# Patient Record
Sex: Female | Born: 1960 | Hispanic: Yes | Marital: Single | Smoking: Never smoker
Health system: Southern US, Community
[De-identification: ages and names within clinical notes are randomized; demographics above are authoritative.]

## PROBLEM LIST (undated history)

## (undated) DIAGNOSIS — I10 Essential (primary) hypertension: Secondary | ICD-10-CM

## (undated) DIAGNOSIS — E7800 Pure hypercholesterolemia, unspecified: Secondary | ICD-10-CM

## (undated) HISTORY — PX: BREAST SURGERY: SHX581

## (undated) HISTORY — PX: CHOLECYSTECTOMY: SHX55

---

## 2009-10-09 ENCOUNTER — Ambulatory Visit: Payer: Self-pay | Admitting: Diagnostic Radiology

## 2009-10-09 ENCOUNTER — Ambulatory Visit (HOSPITAL_BASED_OUTPATIENT_CLINIC_OR_DEPARTMENT_OTHER): Admission: RE | Admit: 2009-10-09 | Discharge: 2009-10-09 | Payer: Self-pay | Admitting: Unknown Physician Specialty

## 2018-04-06 ENCOUNTER — Other Ambulatory Visit: Payer: Self-pay

## 2018-04-06 ENCOUNTER — Emergency Department (HOSPITAL_COMMUNITY)
Admission: EM | Admit: 2018-04-06 | Discharge: 2018-04-06 | Disposition: A | Discharge status: Home / Self Care | Payer: Self-pay | Attending: Emergency Medicine | Admitting: Emergency Medicine

## 2018-04-06 ENCOUNTER — Encounter (HOSPITAL_COMMUNITY): Payer: Self-pay | Admitting: Emergency Medicine

## 2018-04-06 DIAGNOSIS — R519 Headache, unspecified: Secondary | ICD-10-CM

## 2018-04-06 DIAGNOSIS — I10 Essential (primary) hypertension: Secondary | ICD-10-CM | POA: Insufficient documentation

## 2018-04-06 DIAGNOSIS — R51 Headache: Secondary | ICD-10-CM | POA: Insufficient documentation

## 2018-04-06 HISTORY — DX: Essential (primary) hypertension: I10

## 2018-04-06 HISTORY — DX: Pure hypercholesterolemia, unspecified: E78.00

## 2018-04-06 MED ORDER — METOCLOPRAMIDE HCL 5 MG/ML IJ SOLN
10.0000 mg | Freq: Once | INTRAMUSCULAR | Status: AC
  Administered 2018-04-06: 10 mg via INTRAVENOUS
  Filled 2018-04-06: qty 2

## 2018-04-06 MED ORDER — KETOROLAC TROMETHAMINE 15 MG/ML IJ SOLN
15.0000 mg | Freq: Once | INTRAMUSCULAR | Status: AC
Start: 2018-04-06 — End: 2018-04-06
  Administered 2018-04-06: 15 mg via INTRAVENOUS
  Filled 2018-04-06: qty 1

## 2018-04-06 MED ORDER — DIPHENHYDRAMINE HCL 50 MG/ML IJ SOLN
12.5000 mg | Freq: Once | INTRAMUSCULAR | Status: AC
  Administered 2018-04-06: 20:00:00 via INTRAVENOUS
  Filled 2018-04-06: qty 1

## 2018-04-06 MED ORDER — SODIUM CHLORIDE 0.9 % IV BOLUS
1000.0000 mL | Freq: Once | INTRAVENOUS | Status: AC
  Administered 2018-04-06: 1000 mL via INTRAVENOUS

## 2018-04-06 MED ORDER — BUTALBITAL-APAP-CAFFEINE 50-325-40 MG PO TABS: 1.0000 | ORAL_TABLET | Freq: Four times a day (QID) | ORAL | 0 refills | Status: DC | PRN

## 2018-04-06 NOTE — ED Triage Notes (Signed)
Pt c/o headache x 1 week. Seen by PCP, and started on new blood pressure medication yesterday with no change in headache. A&O x 4, no weakness noted.

## 2018-04-06 NOTE — ED Provider Notes (Signed)
[facility] EMERGENCY DEPARTMENT Provider Note   CSN: [id number] Arrival date & time: 04/06/18  4098     History   Chief Complaint Chief Complaint  Patient presents with  . Hypertension  . Headache    HPI Marie Kane is a 57 y.o. female.  HPI   57 year old female with headaches.  Had headaches intermittently for years.  Seem to be worse the higher blood pressure is.  Approximately 2 weeks ago her PCP took her off amlodipine for lower extremity swelling.  She began having headaches around that time.  They are intermittent.  Describes pressure and sometimes stabbing pain in the by temporal region sometimes radiating through to the top of her head into the back of her head.  She has not noticed any other appreciable exacerbating relieving factors.  No acute change in vision.  No nausea.  No photophobia.  No neck stiffness.  No confusion per family at bedside.  She went to see her PCP today who started back on amlodipine but at a lower dose.  He also prescribed naproxen.  She started these today with a headache has persisted.  Past Medical History:  Diagnosis Date  . High cholesterol   . Hypertension     There are no active problems to display for this patient.   History reviewed. No pertinent surgical history.   OB History   None      Home Medications    Prior to Admission medications   Not on File    Family History No family history on file.  Social History Social History   Tobacco Use  . Smoking status: Never Smoker  . Smokeless tobacco: Never Used  Substance Use Topics  . Alcohol use: Not Currently  . Drug use: Never     Allergies   Patient has no known allergies.   Review of Systems Review of Systems  All systems reviewed and negative, other than as noted in HPI.  Physical Exam Updated Vital Signs BP (!) 154/87 (BP Location: Right Arm)   Pulse (!) 58   Temp 98.2 F (36.8 C) (Oral)   Resp 16   SpO2 96%   Physical  Exam  Constitutional: She appears well-developed and well-nourished. No distress.  HENT:  Head: Normocephalic and atraumatic.  Eyes: Conjunctivae are normal. Right eye exhibits no discharge. Left eye exhibits no discharge.  Neck: Neck supple.  Cardiovascular: Normal rate, regular rhythm and normal heart sounds. Exam reveals no gallop and no friction rub.  No murmur heard. Pulmonary/Chest: Effort normal and breath sounds normal. No respiratory distress.  Abdominal: Soft. She exhibits no distension. There is no tenderness.  Musculoskeletal: She exhibits no edema or tenderness.  Neurological: She is alert.  Skin: Skin is warm and dry.  Psychiatric: She has a normal mood and affect. Her behavior is normal. Thought content normal.  Nursing note and vitals reviewed.    ED Treatments / Results  Labs (all labs ordered are listed, but only abnormal results are displayed) Labs Reviewed - No data to display  EKG None  Radiology No results found.  Procedures Procedures (including critical care time)  Medications Ordered in ED Medications  sodium chloride 0.9 % bolus 1,000 mL (has no administration in time range)  ketorolac (TORADOL) 15 MG/ML injection 15 mg (has no administration in time range)  metoCLOPramide (REGLAN) injection 10 mg (has no administration in time range)  diphenhydrAMINE (BENADRYL) injection 12.5 mg (has no administration in time range)  Initial Impression / Assessment and Plan / ED Course  I have reviewed the triage vital signs and the nursing notes.  Pertinent labs & imaging results that were available during my care of the patient were reviewed by me and considered in my medical decision making (see chart for details).     56yF with headache.  Doubt emergent cause.  Seems to be temporally related to her blood pressure.  Medications were recently adjusted.  Advised to keep a log.  Symptoms now much improved.  Final Clinical Impressions(s) / ED Diagnoses    Final diagnoses:  Nonintractable headache, unspecified chronicity pattern, unspecified headache type    ED Discharge Orders    None       Raeford Razor, MD 04/14/18 1338

## 2018-11-08 ENCOUNTER — Emergency Department (HOSPITAL_BASED_OUTPATIENT_CLINIC_OR_DEPARTMENT_OTHER)
Admission: EM | Admit: 2018-11-08 | Discharge: 2018-11-08 | Disposition: A | Discharge status: Home / Self Care | Payer: Self-pay | Attending: Emergency Medicine | Admitting: Emergency Medicine

## 2018-11-08 ENCOUNTER — Other Ambulatory Visit: Payer: Self-pay

## 2018-11-08 ENCOUNTER — Encounter (HOSPITAL_BASED_OUTPATIENT_CLINIC_OR_DEPARTMENT_OTHER): Payer: Self-pay | Admitting: Emergency Medicine

## 2018-11-08 DIAGNOSIS — R42 Dizziness and giddiness: Secondary | ICD-10-CM | POA: Insufficient documentation

## 2018-11-08 DIAGNOSIS — I10 Essential (primary) hypertension: Secondary | ICD-10-CM | POA: Insufficient documentation

## 2018-11-08 DIAGNOSIS — R631 Polydipsia: Secondary | ICD-10-CM | POA: Insufficient documentation

## 2018-11-08 DIAGNOSIS — R531 Weakness: Secondary | ICD-10-CM | POA: Insufficient documentation

## 2018-11-08 LAB — CBC WITH DIFFERENTIAL/PLATELET
Abs Immature Granulocytes: 0.13 10*3/uL — ABNORMAL HIGH (ref 0.00–0.07)
Basophils Absolute: 0 10*3/uL (ref 0.0–0.1)
Basophils Relative: 0 %
Eosinophils Absolute: 0.3 10*3/uL (ref 0.0–0.5)
Eosinophils Relative: 3 %
HCT: 45.1 % (ref 36.0–46.0)
Hemoglobin: 14.8 g/dL (ref 12.0–15.0)
Immature Granulocytes: 2 %
Lymphocytes Relative: 50 %
Lymphs Abs: 4.5 10*3/uL — ABNORMAL HIGH (ref 0.7–4.0)
MCH: 29.2 pg (ref 26.0–34.0)
MCHC: 32.8 g/dL (ref 30.0–36.0)
MCV: 89.1 fL (ref 80.0–100.0)
Monocytes Absolute: 0.9 10*3/uL (ref 0.1–1.0)
Monocytes Relative: 10 %
Neutro Abs: 3.1 10*3/uL (ref 1.7–7.7)
Neutrophils Relative %: 35 %
Platelets: 339 10*3/uL (ref 150–400)
RBC: 5.06 MIL/uL (ref 3.87–5.11)
RDW: 12.9 % (ref 11.5–15.5)
WBC: 8.8 10*3/uL (ref 4.0–10.5)
nRBC: 0 % (ref 0.0–0.2)

## 2018-11-08 LAB — COMPREHENSIVE METABOLIC PANEL
ALT: 38 U/L (ref 0–44)
AST: 23 U/L (ref 15–41)
Albumin: 4 g/dL (ref 3.5–5.0)
Alkaline Phosphatase: 64 U/L (ref 38–126)
Anion gap: 9 (ref 5–15)
BUN: 15 mg/dL (ref 6–20)
CO2: 27 mmol/L (ref 22–32)
Calcium: 8.9 mg/dL (ref 8.9–10.3)
Chloride: 102 mmol/L (ref 98–111)
Creatinine, Ser: 0.73 mg/dL (ref 0.44–1.00)
GFR calc Af Amer: >60 mL/min (ref >60)
GFR calc non Af Amer: >60 mL/min (ref >60)
Glucose, Bld: 109 mg/dL — ABNORMAL HIGH (ref 70–99)
Potassium: 3.9 mmol/L (ref 3.5–5.1)
Sodium: 138 mmol/L (ref 135–145)
Total Bilirubin: 1.3 mg/dL — ABNORMAL HIGH (ref 0.3–1.2)
Total Protein: 6.6 g/dL (ref 6.5–8.1)

## 2018-11-08 MED ORDER — BUTALBITAL-APAP-CAFFEINE 50-325-40 MG PO TABS: 1.0000 | ORAL_TABLET | Freq: Four times a day (QID) | ORAL | 0 refills | Status: AC | PRN

## 2018-11-08 NOTE — ED Triage Notes (Signed)
Patient arrived via POV c/o fatigue x 3 days. Patient states through family member that she's more fatigued while walking. Patient states she has recently started walking more in an effort to exercise. Patient states she feels her blood pressure has been more elevated since these events started. Patient is AO x 4, with elevated BP.

## 2018-11-08 NOTE — Discharge Instructions (Signed)
Make sure you are staying inside and staying cool.  Drink plenty of fluids.  If you are feeling better by the end of the week and the weather is cooler you can try another walk but if you continue to feel the same way you should call your doctor and may need medication adjustment.  If you develop fever, shortness of breath, chest pain, vomiting or other concerns please return to the emergency room or call your doctor for evaluation.

## 2018-11-08 NOTE — ED Provider Notes (Signed)
[facility] EMERGENCY DEPARTMENT Provider Note   CSN: [id number] Arrival date & time: 11/08/18  5366    History   Chief Complaint Chief Complaint  Patient presents with   Fatigue    HPI Marie Kane is a 58 y.o. female.     Patient is a 58 year old female with a history of hypertension, hyperlipidemia and chronic headaches who is presenting today with complaints of feeling generally weak for the last 3 days.  Daughter is interpreting for the patient.  Daughter states approximately 1 week ago she was having pain and discomfort in her left shoulder blade radiating down her arm causing numbness and tingling.  She was seen at the urgent care and given medications that she took for approximately 5 days.  The symptoms completely resolved and she was feeling much better.  Saturday she went for a walk because she had been trying to do more exercise because she had been relatively sedentary and states she just started feeling generally weak.  This has continued into today and is seem to be about the same.  She does not really feel that it is getting worse but is not getting better either.  She describes it as generally weak and feeling lightheaded like she may pass out.  She is not sure if activity makes it worse but when she tried to walk on Sunday she did find it very difficult.  Her symptoms seem to come and go.  She denies any shortness of breath, chest discomfort, nausea, vomiting, abdominal pain or leg swelling associated with the symptoms.  She has been on the same blood pressure cholesterol and potassium medication for some time without any changes.  She denies alcohol or tobacco abuse.  She has not had cough fever or congestion.  She denies any urinary issues.  The history is provided by the patient and a relative. The history is limited by a language barrier. A language interpreter was used.    Past Medical History:  Diagnosis Date   High cholesterol    Hypertension      There are no active problems to display for this patient.   Past Surgical History:  Procedure Laterality Date   BREAST SURGERY     CHOLECYSTECTOMY       OB History   No obstetric history on file.      Home Medications    Prior to Admission medications   Medication Sig Start Date End Date Taking? Authorizing Provider  butalbital-acetaminophen-caffeine (FIORICET, ESGIC) 50-325-40 MG tablet Take 1 tablet by mouth every 6 (six) hours as needed for headache. 04/06/18 04/06/19  Virgel Manifold, MD    Family History History reviewed. No pertinent family history.  Social History Social History   Tobacco Use   Smoking status: Never Smoker   Smokeless tobacco: Never Used  Substance Use Topics   Alcohol use: Not Currently   Drug use: Never     Allergies   Patient has no known allergies.   Review of Systems Review of Systems  Endocrine: Positive for polydipsia.  All other systems reviewed and are negative.    Physical Exam Updated Vital Signs BP (!) 151/72 (BP Location: Right Arm)    Pulse 65    Temp 98.5 F (36.9 C) (Oral)    Resp 18    Ht 5\' 2"  (1.575 m)    Wt 84.4 kg    SpO2 98%    BMI 34.02 kg/m   Physical Exam Vitals signs and nursing note reviewed.  Constitutional:      General: She is not in acute distress.    Appearance: She is well-developed. She is obese.  HENT:     Head: Normocephalic and atraumatic.  Eyes:     Conjunctiva/sclera: Conjunctivae normal.     Pupils: Pupils are equal, round, and reactive to light.  Cardiovascular:     Rate and Rhythm: Normal rate and regular rhythm.     Heart sounds: Normal heart sounds. No murmur. No friction rub.  Pulmonary:     Effort: Pulmonary effort is normal.     Breath sounds: Normal breath sounds. No wheezing or rales.  Abdominal:     General: Bowel sounds are normal. There is no distension.     Palpations: Abdomen is soft.     Tenderness: There is no abdominal tenderness. There is no guarding or  rebound.  Musculoskeletal: Normal range of motion.        General: No tenderness.     Right lower leg: No edema.     Left lower leg: No edema.     Comments: No edema  Skin:    General: Skin is warm and dry.     Findings: No rash.  Neurological:     General: No focal deficit present.     Mental Status: She is alert and oriented to person, place, and time. Mental status is at baseline.     Cranial Nerves: No cranial nerve deficit.  Psychiatric:        Mood and Affect: Mood normal.        Behavior: Behavior normal.        Thought Content: Thought content normal.      ED Treatments / Results  Labs (all labs ordered are listed, but only abnormal results are displayed) Labs Reviewed  CBC WITH DIFFERENTIAL/PLATELET - Abnormal; Notable for the following components:      Result Value   Lymphs Abs 4.5 (*)    Abs Immature Granulocytes 0.13 (*)    All other components within normal limits  COMPREHENSIVE METABOLIC PANEL - Abnormal; Notable for the following components:   Glucose, Bld 109 (*)    Total Bilirubin 1.3 (*)    All other components within normal limits    EKG EKG Interpretation  Date/Time:  Tuesday November 08 2018 07:42:21 EDT Ventricular Rate:  61 PR Interval:    QRS Duration: 95 QT Interval:  433 QTC Calculation: 437 R Axis:   48 Text Interpretation:  Sinus rhythm Low voltage, precordial leads Baseline wander in lead(s) II III aVF No previous tracing Confirmed by Gwyneth SproutPlunkett, Lilliahna Schubring ([id number]) on 11/08/2018 7:55:45 AM   Radiology No results found.  Procedures Procedures (including critical care time)  Medications Ordered in ED Medications - No data to display   Initial Impression / Assessment and Plan / ED Course  I have reviewed the triage vital signs and the nursing notes.  Pertinent labs & imaging results that were available during my care of the patient were reviewed by me and considered in my medical decision making (see chart for details).         58 year old female presenting today with generalized weakness and feeling lightheaded intermittently.  She denies any exertional dyspnea or chest discomfort.  Lower suspicion for PE, dissection, ACS and patient has no symptoms on exam or with history concerning for CHF.  Patient denies any infectious symptoms and low suspicion for COVID.  She is afebrile here with normal vital signs except for blood pressure of 151/72.  Concern for possible anemia versus hypokalemia versus acute kidney injury from recent medications.  Patient does see her doctor regularly and gets routine checkups.  Low suspicion that this is thyroid related.  She has no abdominal pain or palpable masses concerning for AAA. EKG, CBC, CMP pending.  8:36 AM Patient's EKG is within normal limits.  Looking through care everywhere patient had a stress echo done in January which was also normal.  Patient symptoms do not seem to be related to ACS at this time.  CBC without signs of anemia, CMP with normal renal function and potassium without signs of dehydration.  Question whether patient is getting overheated when she is walking outside and this is causing her symptoms.  Recommended she stay inside and stay cool and if she starts to feel better once it cools down try walking again but if symptoms recur she needs to follow-up with her PCP or return if she has developing symptoms.  Patient also straight suffers from daily headaches and asked if she can have some medication for her headaches.  She takes Tylenol usually but did receive Fioricet in the past for severe headaches.  She was given a refill on this.  Final Clinical Impressions(s) / ED Diagnoses   Final diagnoses:  Generalized weakness    ED Discharge Orders         Ordered    butalbital-acetaminophen-caffeine (FIORICET) 50-325-40 MG tablet  Every 6 hours PRN     11/08/18 0834           Gwyneth SproutPlunkett, Aksel Bencomo, MD 11/08/18 [phone]

## 2019-01-23 ENCOUNTER — Emergency Department (HOSPITAL_BASED_OUTPATIENT_CLINIC_OR_DEPARTMENT_OTHER): Admission: EM | Admit: 2019-01-23 | Discharge: 2019-01-23 | Discharge status: Against Medical Advice | Payer: Self-pay

## 2019-01-23 ENCOUNTER — Other Ambulatory Visit: Payer: Self-pay

## 2019-03-20 ENCOUNTER — Emergency Department (HOSPITAL_BASED_OUTPATIENT_CLINIC_OR_DEPARTMENT_OTHER)
Admission: EM | Admit: 2019-03-20 | Discharge: 2019-03-20 | Disposition: A | Discharge status: Home / Self Care | Payer: Self-pay | Attending: Emergency Medicine | Admitting: Emergency Medicine

## 2019-03-20 ENCOUNTER — Emergency Department (HOSPITAL_BASED_OUTPATIENT_CLINIC_OR_DEPARTMENT_OTHER): Payer: Self-pay

## 2019-03-20 ENCOUNTER — Encounter (HOSPITAL_BASED_OUTPATIENT_CLINIC_OR_DEPARTMENT_OTHER): Payer: Self-pay | Admitting: *Deleted

## 2019-03-20 ENCOUNTER — Other Ambulatory Visit: Payer: Self-pay

## 2019-03-20 ENCOUNTER — Encounter (HOSPITAL_BASED_OUTPATIENT_CLINIC_OR_DEPARTMENT_OTHER): Payer: Self-pay | Admitting: Emergency Medicine

## 2019-03-20 DIAGNOSIS — I10 Essential (primary) hypertension: Secondary | ICD-10-CM | POA: Insufficient documentation

## 2019-03-20 DIAGNOSIS — R519 Headache, unspecified: Secondary | ICD-10-CM | POA: Insufficient documentation

## 2019-03-20 DIAGNOSIS — Z79899 Other long term (current) drug therapy: Secondary | ICD-10-CM | POA: Insufficient documentation

## 2019-03-20 DIAGNOSIS — R42 Dizziness and giddiness: Secondary | ICD-10-CM | POA: Insufficient documentation

## 2019-03-20 MED ORDER — AMLODIPINE BESYLATE 5 MG PO TABS
5.0000 mg | ORAL_TABLET | Freq: Once | ORAL | Status: AC
  Administered 2019-03-20: 5 mg via ORAL
  Filled 2019-03-20: qty 1

## 2019-03-20 MED ORDER — CARVEDILOL 6.25 MG PO TABS: 6.2500 mg | ORAL_TABLET | Freq: Two times a day (BID) | ORAL | 2 refills | Status: AC

## 2019-03-20 MED ORDER — ROSUVASTATIN CALCIUM 10 MG PO TABS: 10.0000 mg | ORAL_TABLET | Freq: Every day | ORAL | 2 refills | Status: AC

## 2019-03-20 MED ORDER — PROCHLORPERAZINE EDISYLATE 10 MG/2ML IJ SOLN
10.0000 mg | Freq: Once | INTRAMUSCULAR | Status: DC
  Filled 2019-03-20: qty 2

## 2019-03-20 MED ORDER — AMLODIPINE BESYLATE 5 MG PO TABS: 5.0000 mg | ORAL_TABLET | Freq: Every day | ORAL | 2 refills | Status: AC

## 2019-03-20 MED ORDER — DIPHENHYDRAMINE HCL 50 MG/ML IJ SOLN
25.0000 mg | Freq: Once | INTRAMUSCULAR | Status: DC
  Filled 2019-03-20: qty 1

## 2019-03-20 NOTE — ED Notes (Signed)
Patient transported to CT 

## 2019-03-20 NOTE — ED Provider Notes (Signed)
Patient accepted at shift change to review CT scan and plan for addition of Norvasc back to medication list.  I have interviewed the patient.  Her daughter acts as Optometrist.  She has been having nearly daily headaches that come and go.  She does not currently have a headache.  It is typically lancinating in nature without associated symptoms.  Patient reports that the headache started ever since she stopped taking Norvasc.  She reports compliance with her blood pressure medications.  She has been instructed to take carvedilol once daily 12.5 mg in the evenings.  Apparently she was having some dizziness in the mornings when she was taking an a.m. dose.  She is also taking losartan 100 mg. Physical Exam  BP (!) 185/84 (BP Location: Right Arm)   Pulse 72   Temp 98.6 F (37 C) (Oral)   Resp 17   Ht 5' (1.524 m)   Wt 84.4 kg   SpO2 99%   BMI 36.33 kg/m   Physical Exam Patient is alert and in no distress.  Mental status and speech are clear.  Movements are coordinated purposeful movement without difficulty ED Course/Procedures     Procedures  MDM  CT scan is negative.  Patient does not currently have any headache.  She has no associated neurologic symptoms.  Headaches are nearly daily and they start after she has been awake for a little while.  I have advised to start taking the carvedilol 6.25 mg twice daily rather than 12.5 mg at night as a single dose.  We will add back the Norvasc 5 mg.  She felt that she did not have any headaches when she was taking this.  It was discontinued due to some ankle swelling which she feels was not too bad.  They are counseled to monitor blood pressures, follow-up closely with PCP and return precautions given.       Charlesetta Shanks, MD 03/20/19 [phone]

## 2019-03-20 NOTE — Discharge Instructions (Signed)
1.  Take carvedilol 6.25 mg twice daily. 2.  Start Norvasc 5 mg daily. 3.  You may take Tylenol as needed per package instructions for general headache. 4.  Return to the emergency department if you develop worsening or new symptoms.  Return if you have problems with your vision, feel off balance, have weakness numbness or tingling of your arms or legs, confusion or any other concerning symptoms. 5.  See your family doctor for recheck within the next 2 to 5 days. 6.  Write down your blood pressures 3 times a day.

## 2019-03-20 NOTE — ED Provider Notes (Addendum)
[facility] EMERGENCY DEPARTMENT Provider Note   CSN: [id number] Arrival date & time: 03/20/19  0554     History   Chief Complaint Chief Complaint  Patient presents with  . Headache    HPI Marie Kane is a 58 y.o. female.     HPI  This is a 58 year old female with history of hypertension hypercholesterolemia who presents with headaches.  Patient presents with her daughter.  Patient reports almost daily headaches over the last several months.  She attributes these to her high blood pressure.  She reports that she previously had recurrent headaches when her blood pressure was not controlled.  When she was placed on the correct medication and her blood pressure was under control, she states that her headaches had improved.  However, she was taken off of that blood pressure medicine.  She cannot tell me what the medication is.  She reports daily bitemporal headaches that are pulsating in nature.  She reports nausea without vomiting.  Took tylenol without relief.  No photophobia, vision changes, weakness, numbness, tingling.  She reports that she has reported this to her primary physician but that they have not changed her medications at all.  She states that this morning she woke up crying because of pain.  Currently her pain is "just a little bit" but it comes and goes.  She denies worst headache of her life.  Denies fevers, neck pain, chest pain, shortness of breath, abdominal pain.  Unfortunately, this chart was started prior to confirming last name.  This will be merged with her other chart.  I have reviewed her other chart.  She has had several visits in the past for headache which she has related to her blood pressure.  She also had a neurology visit in January for which she did not have any further work-up due to her own preference.  Past Medical History:  Diagnosis Date  . High cholesterol   . Hypertension     There are no active problems to display for this  patient.   Past Surgical History:  Procedure Laterality Date  . CESAREAN SECTION CLASSICAL    . CHOLECYSTECTOMY       OB History   No obstetric history on file.      Home Medications    Prior to Admission medications   Medication Sig Start Date End Date Taking? Authorizing Provider  carvedilol (COREG) 12.5 MG tablet Take 12.5 mg by mouth 2 (two) times daily with a meal.   Yes [provider]  potassium chloride (MICRO-K) 10 MEQ CR capsule Take 10 mEq by mouth 2 (two) times daily.   Yes [provider]  rosuvastatin (CRESTOR) 10 MG tablet Take 10 mg by mouth daily.   Yes [provider]    Family History No family history on file.  Social History Social History   Tobacco Use  . Smoking status: Never Smoker  . Smokeless tobacco: Never Used  Substance Use Topics  . Alcohol use: Never    Frequency: Never  . Drug use: Never     Allergies   Patient has no allergy information on record.   Review of Systems Review of Systems  Constitutional: Negative for fever.  Eyes: Negative for photophobia and visual disturbance.  Respiratory: Negative for shortness of breath.   Cardiovascular: Negative for chest pain.  Gastrointestinal: Positive for nausea. Negative for vomiting.  Genitourinary: Negative for dysuria.  Neurological: Positive for headaches. Negative for weakness and numbness.  All other systems  reviewed and are negative.    Physical Exam Updated Vital Signs BP (!) 185/84 (BP Location: Right Arm)   Pulse 72   Temp 98.6 F (37 C) (Oral)   Resp 17   Ht 1.524 m (5')   Wt 84.4 kg   SpO2 99%   BMI 36.33 kg/m   Physical Exam Vitals signs and nursing note reviewed.  Constitutional:      Appearance: She is well-developed.     Comments: Overweight, no acute distress  HENT:     Head: Normocephalic and atraumatic.  Eyes:     Extraocular Movements: Extraocular movements intact.     Pupils: Pupils are equal, round, and reactive to  light.  Neck:     Musculoskeletal: Normal range of motion and neck supple.     Comments: No meningismus Cardiovascular:     Rate and Rhythm: Normal rate and regular rhythm.     Heart sounds: Normal heart sounds.  Pulmonary:     Effort: Pulmonary effort is normal. No respiratory distress.     Breath sounds: No wheezing.  Abdominal:     General: Bowel sounds are normal.     Palpations: Abdomen is soft.  Skin:    General: Skin is warm and dry.  Neurological:     Mental Status: She is alert and oriented to person, place, and time.     Comments: Fluent speech, cranial nerves II through XII intact, 5 out of 5 strength in all 4 extremities  Psychiatric:        Mood and Affect: Mood normal.      ED Treatments / Results  Labs (all labs ordered are listed, but only abnormal results are displayed) Labs Reviewed - No data to display  EKG EKG Interpretation  Date/Time:  Monday March 20 2019 06:07:47 EDT Ventricular Rate:  71 PR Interval:    QRS Duration: 110 QT Interval:  444 QTC Calculation: 483 R Axis:   55 Text Interpretation:  Sinus rhythm Confirmed by Ross Marcus ([id number]) on 03/20/2019 6:10:08 AM   Radiology No results found.  Procedures Procedures (including critical care time)  Medications Ordered in ED Medications  prochlorperazine (COMPAZINE) injection 10 mg (has no administration in time range)  diphenhydrAMINE (BENADRYL) injection 25 mg (has no administration in time range)  amLODipine (NORVASC) tablet 5 mg (has no administration in time range)     Initial Impression / Assessment and Plan / ED Course  I have reviewed the triage vital signs and the nursing notes.  Pertinent labs & imaging results that were available during my care of the patient were reviewed by me and considered in my medical decision making (see chart for details).        Patient presents with acute on chronic headache.  Reports daily headache that she associates with her blood  pressure.  She is notably hypertensive with a blood pressure 185/84.  Her current blood pressure medication is carvedilol.  Based on chart review, it looks like she used to be on amlodipine.  Patient was given headache cocktail.  Will obtain a CT scan as patient has not had any recent imaging and is having recurrent ongoing headache.  Low suspicion for subarachnoid or infectious etiology.  Patient was also given a dose of amlodipine here to see if this helped her blood pressure.  Patient signed out to oncoming provider.  Final Clinical Impressions(s) / ED Diagnoses   Final diagnoses:  Bad headache    ED Discharge Orders    None  Shon BatonHorton,  F, MD 03/20/19 [id number]    Shon BatonHorton,  F, MD 03/20/19 [phone]

## 2019-03-20 NOTE — ED Triage Notes (Addendum)
Pt with a history of hypertension. States h/a started last night 2pm. Took tylenol for pain last dose was 2215 last night. C/o nausea. Denies vomiting. Has been taking her blood pressure meds. States pain comes and goes.  Pt speaks no [place]. Speaks Spanish.

## 2020-11-24 IMAGING — CT CT HEAD W/O CM
3 series · 15 of 46 positions shown, 18 images · non-contrast
Comparison: None.

CLINICAL DATA: Headache, chronic, normal neuro exam

EXAM:
CT HEAD WITHOUT CONTRAST
TECHNIQUE: Contiguous axial images were obtained from the base of the skull
through the vertex without intravenous contrast.

[Series 2: head wo · axial · 0.39mm/px · z∈[-217,-97]mm · 9 of 29 slices shown, 12 images]
[im 3/29  brain]
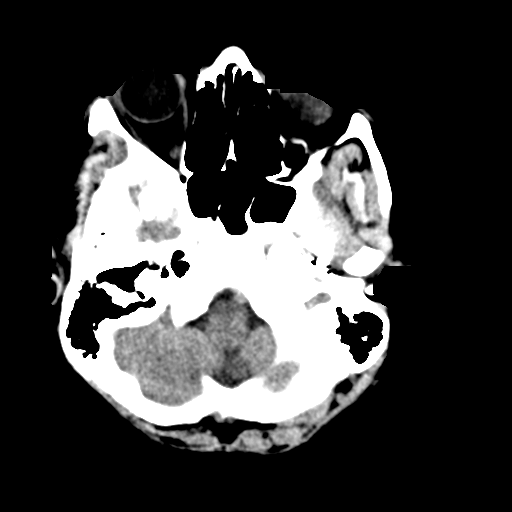
[im 3/29  bone]
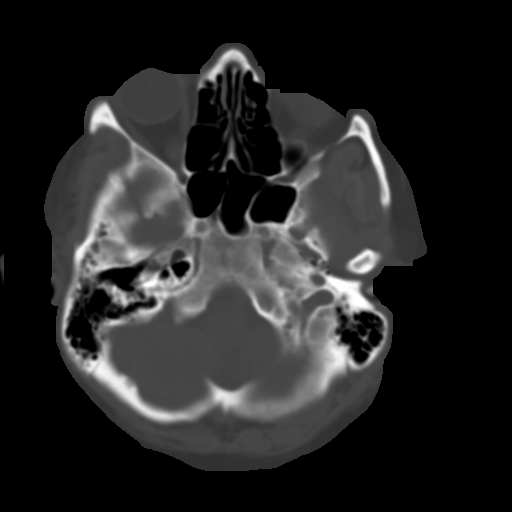
[im 6/29  brain]
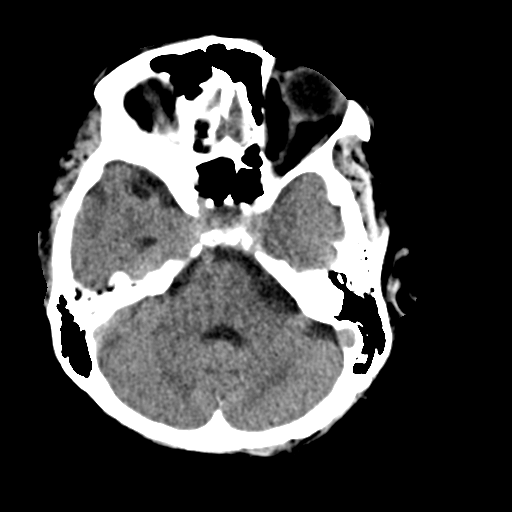
[im 9/29  brain]
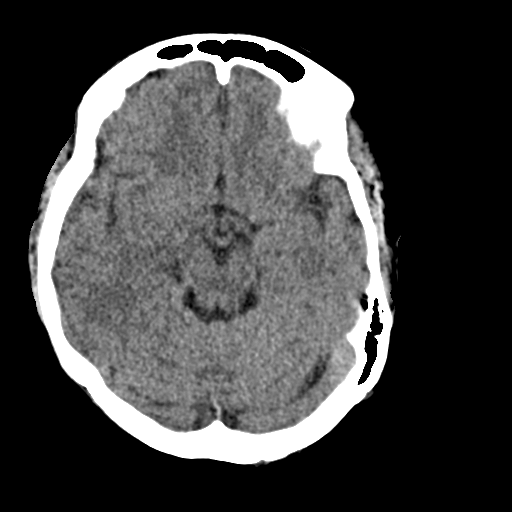
[im 12/29  brain]
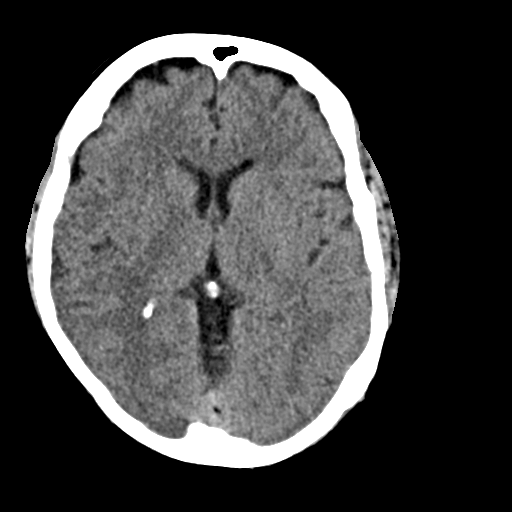
[im 15/29  brain]
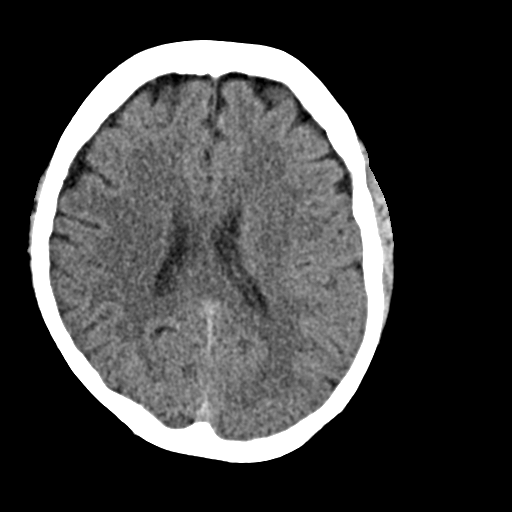
[im 15/29  bone]
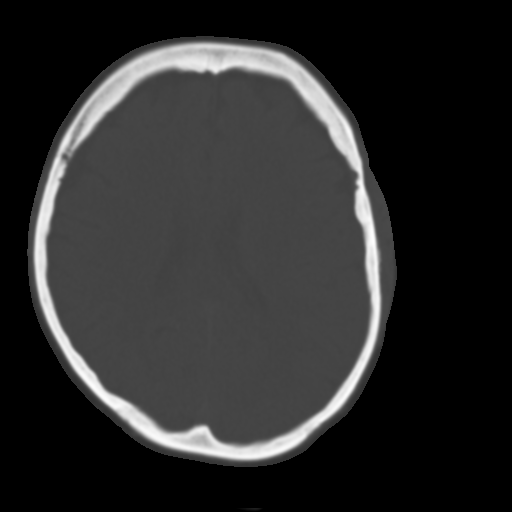
[im 18/29  brain]
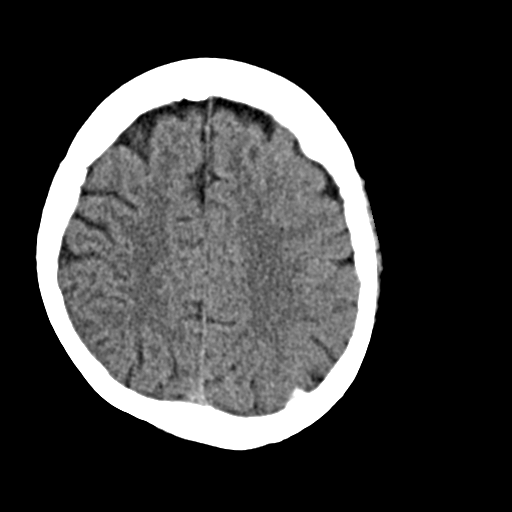
[im 21/29  brain]
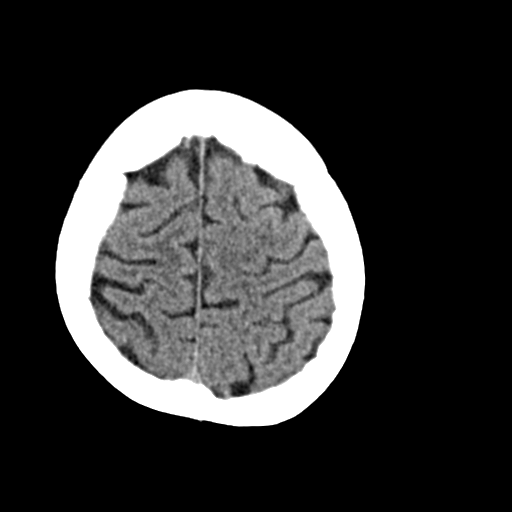
[im 24/29  brain]
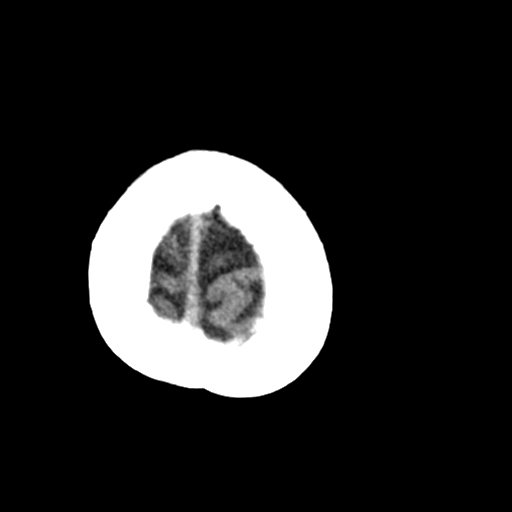
[im 27/29  brain]
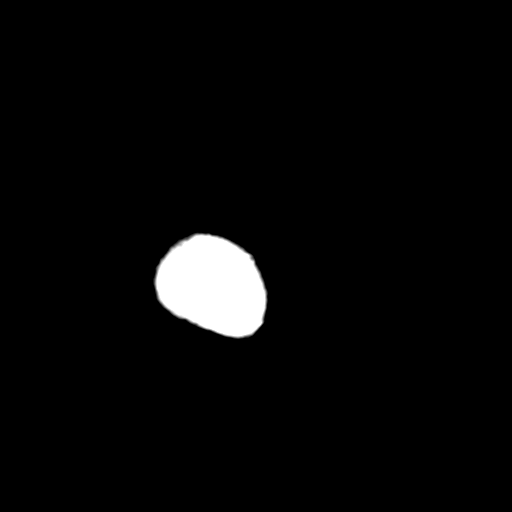
[im 27/29  bone]
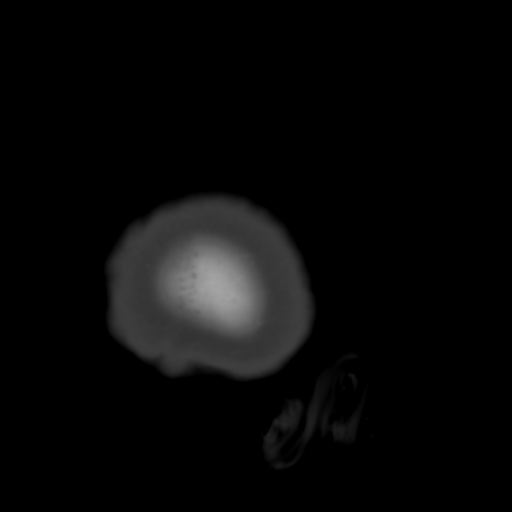

[Series 4: coronal soft · coronal · 0.29mm/px · 3 of 61 slices shown]
[im 21/61  brain]
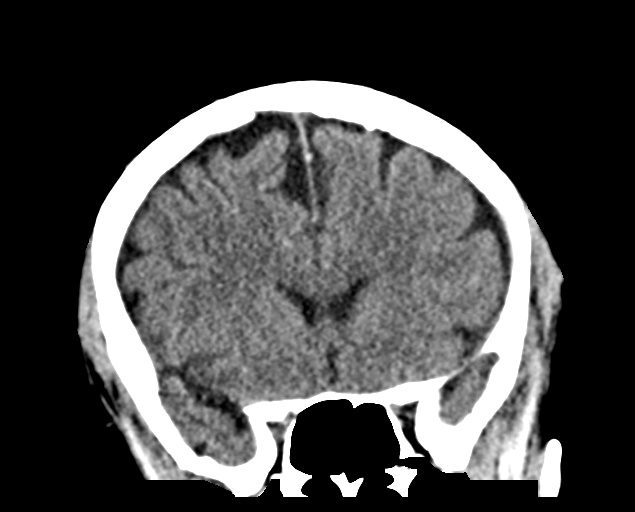
[im 27/61  brain]
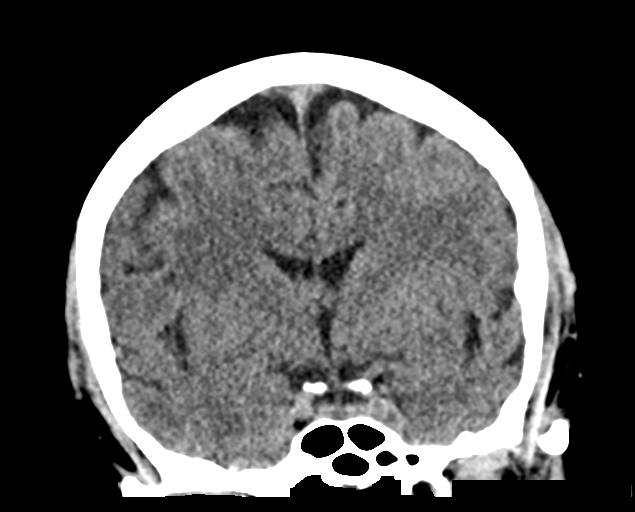
[im 34/61  brain]
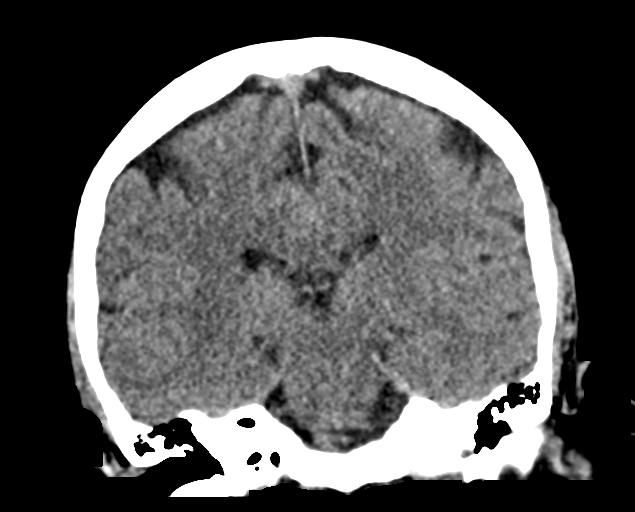

[Series 5: sag soft · sagittal · 0.28mm/px · 3 of 53 slices shown]
[im 18/53  brain]
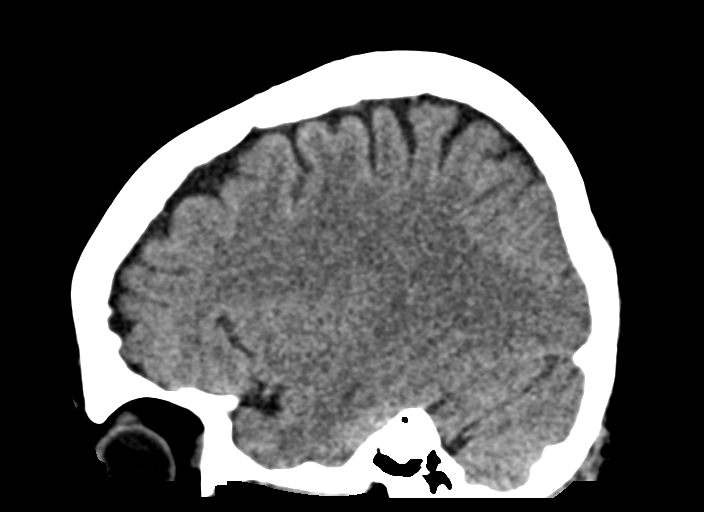
[im 27/53  brain]
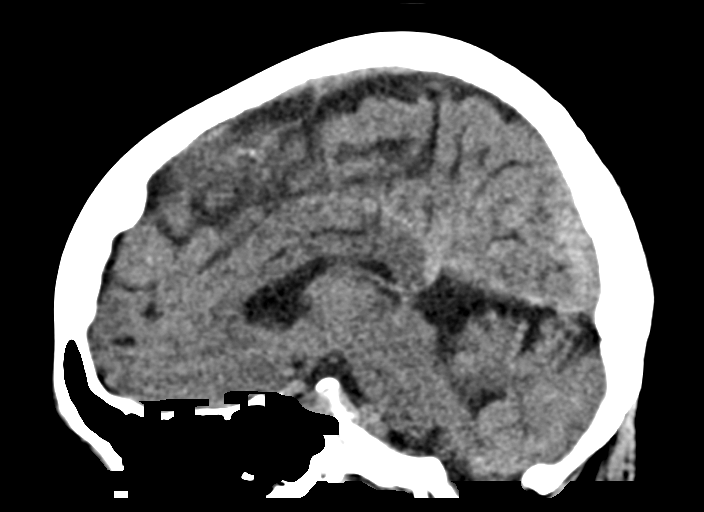
[im 35/53  brain]
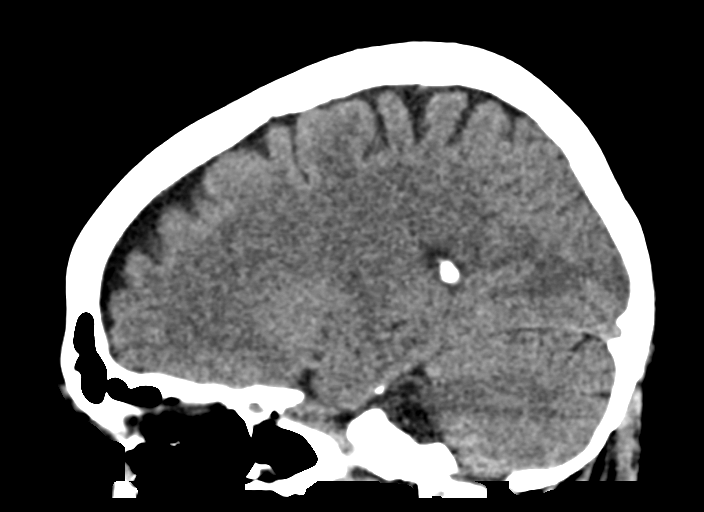

[15 of 46 positions shown; findings below may reference images not displayed]

FINDINGS: Brain: No intracranial hemorrhage, mass effect, or midline shift. No
hydrocephalus. The basilar cisterns are patent. No evidence of
territorial infarct or acute ischemia. No extra-axial or
intracranial fluid collection.

Vascular: No hyperdense vessel or unexpected calcification.

Skull: No fracture or focal lesion.

Sinuses/Orbits: Paranasal sinuses and mastoid air cells are clear.
The visualized orbits are unremarkable.

Other: None.
IMPRESSION: Unremarkable noncontrast head CT.
